# Patient Record
Sex: Male | Born: 1935 | Race: White | Hispanic: No | Marital: Married | Smoking: Never smoker
Health system: Southern US, Community
[De-identification: ages and names within clinical notes are randomized; demographics above are authoritative.]

## PROBLEM LIST (undated history)

## (undated) DIAGNOSIS — I10 Essential (primary) hypertension: Secondary | ICD-10-CM

## (undated) DIAGNOSIS — M1990 Unspecified osteoarthritis, unspecified site: Secondary | ICD-10-CM

## (undated) HISTORY — PX: JOINT REPLACEMENT: SHX530

## (undated) HISTORY — PX: BACK SURGERY: SHX140

---

## 2003-11-13 ENCOUNTER — Other Ambulatory Visit: Payer: Self-pay

## 2004-10-24 ENCOUNTER — Ambulatory Visit: Payer: Self-pay | Admitting: Physician Assistant

## 2005-02-20 ENCOUNTER — Ambulatory Visit: Payer: Self-pay | Admitting: Physician Assistant

## 2005-03-13 ENCOUNTER — Ambulatory Visit: Payer: Self-pay | Admitting: Pain Medicine

## 2005-07-23 ENCOUNTER — Ambulatory Visit: Payer: Self-pay | Admitting: Physician Assistant

## 2005-11-18 ENCOUNTER — Ambulatory Visit: Payer: Self-pay | Admitting: Physician Assistant

## 2006-03-19 ENCOUNTER — Ambulatory Visit: Payer: Self-pay | Admitting: Physician Assistant

## 2006-04-02 ENCOUNTER — Ambulatory Visit: Payer: Self-pay | Admitting: Pain Medicine

## 2006-04-20 ENCOUNTER — Ambulatory Visit: Payer: Self-pay | Admitting: Physician Assistant

## 2008-05-12 ENCOUNTER — Other Ambulatory Visit: Payer: Self-pay

## 2008-05-12 ENCOUNTER — Ambulatory Visit: Payer: Self-pay | Admitting: Unknown Physician Specialty

## 2008-05-19 ENCOUNTER — Ambulatory Visit: Payer: Self-pay | Admitting: Unknown Physician Specialty

## 2010-08-12 ENCOUNTER — Ambulatory Visit (HOSPITAL_COMMUNITY): Admission: RE | Admit: 2010-08-12 | Discharge: 2010-08-13 | Payer: Self-pay | Admitting: Neurosurgery

## 2011-03-07 LAB — TYPE AND SCREEN: Antibody Screen: NEGATIVE

## 2011-03-07 LAB — CBC
HCT: 42.5 % (ref 39.0–52.0)
MCH: 34.3 pg — ABNORMAL HIGH (ref 26.0–34.0)
MCHC: 34.6 g/dL (ref 30.0–36.0)
MCV: 99.1 fL (ref 78.0–100.0)
WBC: 6.1 10*3/uL (ref 4.0–10.5)

## 2011-03-07 LAB — POCT I-STAT 4, (NA,K, GLUC, HGB,HCT): HCT: 45 % (ref 39.0–52.0)

## 2011-03-07 LAB — BASIC METABOLIC PANEL
Calcium: 9.4 mg/dL (ref 8.4–10.5)
Chloride: 104 mEq/L (ref 96–112)
Creatinine, Ser: 1.04 mg/dL (ref 0.4–1.5)
GFR calc Af Amer: >60 mL/min (ref >60)

## 2011-03-07 LAB — DIFFERENTIAL
Eosinophils Relative: 2 % (ref 0–5)
Lymphocytes Relative: 32 % (ref 12–46)
Neutrophils Relative %: 51 % (ref 43–77)

## 2011-03-07 LAB — ABO/RH: ABO/RH(D): O NEG

## 2012-09-27 ENCOUNTER — Ambulatory Visit: Payer: Self-pay | Admitting: Unknown Physician Specialty

## 2012-10-15 ENCOUNTER — Ambulatory Visit: Payer: Self-pay | Admitting: Unknown Physician Specialty

## 2014-08-21 ENCOUNTER — Encounter (HOSPITAL_COMMUNITY): Payer: Self-pay | Admitting: Pharmacy Technician

## 2014-08-21 ENCOUNTER — Other Ambulatory Visit: Payer: Self-pay | Admitting: Orthopedic Surgery

## 2014-08-21 NOTE — Pre-Procedure Instructions (Signed)
Brad Mccarty  08/21/2014   Your procedure is scheduled on:  Wednesday, September 9th  Report to [facility] Admitting at 630 AM.  Call this number if you have problems the morning of surgery: [phone]   Remember:   Do not eat food or drink liquids after midnight.   Take these medicines the morning of surgery with A SIP OF WATER: allopurinol, hydrocodone if needed   Do not wear jewelry.  Do not wear lotions, powders, or perfumes. You may wear deodorant.  Do not shave 48 hours prior to surgery. Men may shave face and neck.  Do not bring valuables to the hospital.  [facility] is not responsible  for any belongings or valuables.               Contacts, dentures or bridgework may not be worn into surgery.  Leave suitcase in the car. After surgery it may be brought to your room.  For patients admitted to the hospital, discharge time is determined by your treatment team.               Patients discharged the day of surgery will not be allowed to drive home.  Please read over the following fact sheets that you were given: Pain Booklet, Coughing and Deep Breathing, Blood Transfusion Information, MRSA Information and Surgical Site Infection Prevention [place] - Preparing for Surgery  Before surgery, you can play an important role.  Because skin is not sterile, your skin needs to be as free of germs as possible.  You can reduce the number of germs on you skin by washing with CHG (chlorahexidine gluconate) soap before surgery.  CHG is an antiseptic cleaner which kills germs and bonds with the skin to continue killing germs even after washing.  Please DO NOT use if you have an allergy to CHG or antibacterial soaps.  If your skin becomes reddened/irritated stop using the CHG and inform your nurse when you arrive at Short Stay.  Do not shave (including legs and underarms) for at least 48 hours prior to the first CHG shower.  You may shave your face.  Please follow these  instructions carefully:   1.  Shower with CHG Soap the night before surgery and the morning of Surgery.  2.  If you choose to wash your hair, wash your hair first as usual with your normal shampoo.  3.  After you shampoo, rinse your hair and body thoroughly to remove the shampoo.  4.  Use CHG as you would any other liquid soap.  You can apply CHG directly to the skin and wash gently with scrungie or a clean washcloth.  5.  Apply the CHG Soap to your body ONLY FROM THE NECK DOWN.  Do not use on open wounds or open sores.  Avoid contact with your eyes, ears, mouth and genitals (private parts).  Wash genitals (private parts) with your normal soap.  6.  Wash thoroughly, paying special attention to the area where your surgery will be performed.  7.  Thoroughly rinse your body with warm water from the neck down.  8.  DO NOT shower/wash with your normal soap after using and rinsing off the CHG Soap.  9.  Pat yourself dry with a clean towel.            10.  Wear clean pajamas.            11.  Place clean sheets on your bed the night of  your first shower and do not sleep with pets.  Day of Surgery  Do not apply any lotions/deoderants the morning of surgery.  Please wear clean clothes to the hospital/surgery center.

## 2014-08-22 ENCOUNTER — Encounter (HOSPITAL_COMMUNITY)
Admission: RE | Admit: 2014-08-22 | Discharge: 2014-08-22 | Disposition: A | Discharge status: Home / Self Care | Payer: Medicare Other | Source: Ambulatory Visit | Attending: Anesthesiology | Admitting: Anesthesiology

## 2014-08-22 ENCOUNTER — Encounter (HOSPITAL_COMMUNITY)
Admission: RE | Admit: 2014-08-22 | Discharge: 2014-08-22 | Disposition: A | Discharge status: Home / Self Care | Payer: Medicare Other | Source: Ambulatory Visit | Attending: Orthopedic Surgery | Admitting: Orthopedic Surgery

## 2014-08-22 ENCOUNTER — Encounter (HOSPITAL_COMMUNITY): Payer: Self-pay

## 2014-08-22 DIAGNOSIS — M5137 Other intervertebral disc degeneration, lumbosacral region: Secondary | ICD-10-CM | POA: Insufficient documentation

## 2014-08-22 DIAGNOSIS — Z01818 Encounter for other preprocedural examination: Secondary | ICD-10-CM | POA: Diagnosis present

## 2014-08-22 DIAGNOSIS — M51379 Other intervertebral disc degeneration, lumbosacral region without mention of lumbar back pain or lower extremity pain: Secondary | ICD-10-CM | POA: Insufficient documentation

## 2014-08-22 DIAGNOSIS — Z9889 Other specified postprocedural states: Secondary | ICD-10-CM | POA: Insufficient documentation

## 2014-08-22 DIAGNOSIS — M48061 Spinal stenosis, lumbar region without neurogenic claudication: Secondary | ICD-10-CM | POA: Diagnosis not present

## 2014-08-22 HISTORY — DX: Essential (primary) hypertension: I10

## 2014-08-22 HISTORY — DX: Unspecified osteoarthritis, unspecified site: M19.90

## 2014-08-22 LAB — SURGICAL PCR SCREEN
MRSA, PCR: POSITIVE — AB
Staphylococcus aureus: POSITIVE — AB

## 2014-08-22 LAB — CBC
HCT: 42.9 % (ref 39.0–52.0)
HEMOGLOBIN: 14.8 g/dL (ref 13.0–17.0)
MCH: 34.7 pg — AB (ref 26.0–34.0)
MCHC: 34.5 g/dL (ref 30.0–36.0)
MCV: 100.5 fL — AB (ref 78.0–100.0)
Platelets: 222 10*3/uL (ref 150–400)
RBC: 4.27 MIL/uL (ref 4.22–5.81)
RDW: 13.4 % (ref 11.5–15.5)
WBC: 9.6 10*3/uL (ref 4.0–10.5)

## 2014-08-22 LAB — BASIC METABOLIC PANEL
Anion gap: 15 (ref 5–15)
BUN: 27 mg/dL — ABNORMAL HIGH (ref 6–23)
CO2: 24 meq/L (ref 19–32)
CREATININE: 1.21 mg/dL (ref 0.50–1.35)
Calcium: 9.7 mg/dL (ref 8.4–10.5)
Chloride: 102 mEq/L (ref 96–112)
GFR calc Af Amer: 64 mL/min — ABNORMAL LOW (ref >90)
GFR calc non Af Amer: 56 mL/min — ABNORMAL LOW (ref >90)
GLUCOSE: 97 mg/dL (ref 70–99)
Potassium: 6.9 mEq/L (ref 3.7–5.3)
Sodium: 141 mEq/L (ref 137–147)

## 2014-08-22 LAB — TYPE AND SCREEN
ABO/RH(D): O NEG
Antibody Screen: NEGATIVE

## 2014-08-22 NOTE — Progress Notes (Signed)
rx called into [facility] [place] for mupirocin

## 2014-08-22 NOTE — Progress Notes (Signed)
Primary - dr. Mikey Bussing ([facility]) No cardiologist - no ekg in last 5 years

## 2014-08-23 NOTE — Progress Notes (Addendum)
Anesthesia Chart Review:  Patient is a 80 year old male is a 80 year old male posted for L5-S1 decompression and fusion with allograft on 08/30/14 by Dr. Yevette Edwards.  History includes HTN, arthritis, bilateral TKR. He is also on Aricept. PCP is Dr. Lindwood [facility]; [phone]; fax [phone]).   Meds: Vitamin C, cyanocobalamin, Norco, allopurinal, Aricept, Lescol, Folvite, Neurontin, Benicar, Clinoril.  EKG on 08/22/14 showed: NSR, incomplete right BBB. I don't see a comparison EKG in Muse of Epic.  CXR on 08/22/14 showed: No active cardiopulmonary disease.  Preoperative labs noted.  K 6.9, BUN 27, Cr 1.21, Glucose 97.  H/H 14.8/42.9. PLT count 222K. I notified Dr. Marshell Levan PA Dorathy Daft about elevated K+, hemolysis is likely effecting.  She recommended having patient get repeat labs at his PCP office (he lives in [place]) to ensure hyperkalemia is resolved prior to surgery. I have notified Carla at Dr. Marshell Levan office.  She will notify Mr. Lemming and touch base with Dr. Neita Garnet office as well. I asked that she fax me his repeat lab results once received--otherwise he will need an ISTAT on arrival.  Velna Ochs [facility] Short Stay Center/Anesthesiology Phone [phone] 08/23/2014 2:59 PM  Addendum: 08/23/2014 3:52 PM Carla spoke with Dr. Neita Garnet office.  They were going to have him come in for repeat labs on 08/29/14.  When she called to notify the patient, he reported that he actually has to be in [place] on 08/29/14 so it would be easier for him to get labs repeated at [facility].  Also Dr. Marshell Levan orders are now in Epic so there are some additional labs that will need to be done as well (includes UA, CMET [will only have them do a HFP since he already had a BMET], PT/PTT). I've ordered an ISTAT4 so we'll have a quick repeat potassium result. Albin Felling will tell him to arrive between 8:30 AM and 3:00 PM on 08/29/14. I'll have our schedulers create a lab only visit.    Addendum:  08/24/2014 9:23 AM Received another call from [place].  Patient has now decided to get labs at his PCP office on 08/29/14.  She will fax his repeat labs once received.  I left a message for Albin Felling regarding labs that weren't done at [facility] due to orders not being signed. Additional labs not yet done that were ordered by the surgeon will need to be done on the day of surgery.   Addendum: 08/29/2014 5:15 PM I received labs from PCP this afternoon. BMET done there today showed a K of 4.7 but elevated BUN 42 and Cr 1.8 (previously 27, 1.21 on 08/22/14).  I left a voice message for Dr. Mikey Bussing around 3:30 PM today, but just received a call back from his nurse Steward Drone. She reports his previous BUN/Cr from 08/01/13 were 22/1.20.  Dr. Mikey Bussing has reviewed today's labs and is not completely sure why his BUN/Cr are further elevated now.  Since his surgery was scheduled for tomorrow morning, I had already reviewed these new labs with anesthesiologist Dr. Noreene Larsson.  He recommended repeating a BMET (already scheduled for CMET) tomorrow.  If Cr is further elevated at 2.0 or above, surgery would likely be canceled.  If stable or improved and other labs acceptable then hopefully he could proceed as planned with continued monitoring of his renal function post-operatively.  Steward Drone reported that this seemed like a reasonable plan from their standpoint.  Further evaluation and review of same day labs per his assigned anesthesiologist tomorrow morning.

## 2014-08-29 MED ORDER — CEFAZOLIN SODIUM-DEXTROSE 2-3 GM-% IV SOLR
2.0000 g | INTRAVENOUS | Status: AC
  Administered 2014-08-30 (×2): 2 g via INTRAVENOUS
  Filled 2014-08-29: qty 50

## 2014-08-30 ENCOUNTER — Inpatient Hospital Stay (HOSPITAL_COMMUNITY)
Admission: RE | Admit: 2014-08-30 | Discharge: 2014-09-01 | DRG: 460 | Disposition: A | Discharge status: Home Health | Payer: Medicare Other | Source: Ambulatory Visit | Attending: Orthopedic Surgery | Admitting: Orthopedic Surgery

## 2014-08-30 ENCOUNTER — Encounter (HOSPITAL_COMMUNITY): Payer: Self-pay

## 2014-08-30 ENCOUNTER — Encounter (HOSPITAL_COMMUNITY)
Admission: RE | Disposition: A | Discharge status: Home Health | Payer: Medicare Other | Source: Ambulatory Visit | Attending: Orthopedic Surgery

## 2014-08-30 ENCOUNTER — Inpatient Hospital Stay (HOSPITAL_COMMUNITY): Payer: Medicare Other | Admitting: Anesthesiology

## 2014-08-30 ENCOUNTER — Encounter (HOSPITAL_COMMUNITY): Payer: Medicare Other | Admitting: Vascular Surgery

## 2014-08-30 ENCOUNTER — Inpatient Hospital Stay (HOSPITAL_COMMUNITY): Payer: Medicare Other

## 2014-08-30 DIAGNOSIS — I10 Essential (primary) hypertension: Secondary | ICD-10-CM | POA: Diagnosis present

## 2014-08-30 DIAGNOSIS — Z981 Arthrodesis status: Secondary | ICD-10-CM

## 2014-08-30 DIAGNOSIS — Z96659 Presence of unspecified artificial knee joint: Secondary | ICD-10-CM | POA: Diagnosis not present

## 2014-08-30 DIAGNOSIS — Z79899 Other long term (current) drug therapy: Secondary | ICD-10-CM | POA: Diagnosis not present

## 2014-08-30 DIAGNOSIS — M5410 Radiculopathy, site unspecified: Secondary | ICD-10-CM | POA: Diagnosis present

## 2014-08-30 DIAGNOSIS — M79609 Pain in unspecified limb: Secondary | ICD-10-CM | POA: Diagnosis present

## 2014-08-30 DIAGNOSIS — I959 Hypotension, unspecified: Secondary | ICD-10-CM | POA: Diagnosis present

## 2014-08-30 DIAGNOSIS — M5137 Other intervertebral disc degeneration, lumbosacral region: Principal | ICD-10-CM | POA: Diagnosis present

## 2014-08-30 DIAGNOSIS — M51379 Other intervertebral disc degeneration, lumbosacral region without mention of lumbar back pain or lower extremity pain: Principal | ICD-10-CM | POA: Diagnosis present

## 2014-08-30 LAB — CBC WITH DIFFERENTIAL/PLATELET
BASOS ABS: 0.1 10*3/uL (ref 0.0–0.1)
BASOS PCT: 1 % (ref 0–1)
EOS ABS: 0.3 10*3/uL (ref 0.0–0.7)
Eosinophils Relative: 4 % (ref 0–5)
HCT: 36.6 % — ABNORMAL LOW (ref 39.0–52.0)
HEMOGLOBIN: 11 g/dL — AB (ref 13.0–17.0)
LYMPHS PCT: 33 % (ref 12–46)
Lymphs Abs: 2.3 10*3/uL (ref 0.7–4.0)
MCH: 30.3 pg (ref 26.0–34.0)
MCHC: 30.1 g/dL (ref 30.0–36.0)
MCV: 100.8 fL — ABNORMAL HIGH (ref 78.0–100.0)
MONO ABS: 1 10*3/uL (ref 0.1–1.0)
Monocytes Relative: 14 % — ABNORMAL HIGH (ref 3–12)
NEUTROS PCT: 48 % (ref 43–77)
Neutro Abs: 3.3 10*3/uL (ref 1.7–7.7)
PLATELETS: 146 10*3/uL — AB (ref 150–400)
RBC: 3.63 MIL/uL — ABNORMAL LOW (ref 4.22–5.81)
RDW: 13 % (ref 11.5–15.5)
WBC: 7 10*3/uL (ref 4.0–10.5)

## 2014-08-30 LAB — COMPREHENSIVE METABOLIC PANEL
ALBUMIN: 3.5 g/dL (ref 3.5–5.2)
ALT: 13 U/L (ref 0–53)
ANION GAP: 13 (ref 5–15)
AST: 19 U/L (ref 0–37)
Alkaline Phosphatase: 63 U/L (ref 39–117)
BUN: 35 mg/dL — ABNORMAL HIGH (ref 6–23)
CO2: 24 mEq/L (ref 19–32)
Calcium: 8.9 mg/dL (ref 8.4–10.5)
Chloride: 98 mEq/L (ref 96–112)
Creatinine, Ser: 1.25 mg/dL (ref 0.50–1.35)
GFR calc Af Amer: 62 mL/min — ABNORMAL LOW (ref >90)
GFR calc non Af Amer: 53 mL/min — ABNORMAL LOW (ref >90)
Glucose, Bld: 90 mg/dL (ref 70–99)
POTASSIUM: 4.8 meq/L (ref 3.7–5.3)
SODIUM: 135 meq/L — AB (ref 137–147)
TOTAL PROTEIN: 6.5 g/dL (ref 6.0–8.3)
Total Bilirubin: 0.7 mg/dL (ref 0.3–1.2)

## 2014-08-30 LAB — APTT: APTT: 36 s (ref 24–37)

## 2014-08-30 LAB — PROTIME-INR
INR: 1.02 (ref 0.00–1.49)
PROTHROMBIN TIME: 13.4 s (ref 11.6–15.2)

## 2014-08-30 SURGERY — POSTERIOR LUMBAR FUSION 1 LEVEL
Anesthesia: General | Site: Spine Lumbar

## 2014-08-30 MED ORDER — DIPHENHYDRAMINE HCL 12.5 MG/5ML PO ELIX: 12.5000 mg | ORAL_SOLUTION | Freq: Four times a day (QID) | ORAL | Status: DC | PRN

## 2014-08-30 MED ORDER — SODIUM CHLORIDE 0.9 % IV SOLN: 250.0000 mL | INTRAVENOUS | Status: DC

## 2014-08-30 MED ORDER — DIAZEPAM 5 MG PO TABS
5.0000 mg | ORAL_TABLET | Freq: Four times a day (QID) | ORAL | Status: DC | PRN
  Administered 2014-08-30 – 2014-08-31 (×2): 5 mg via ORAL
  Filled 2014-08-30 (×2): qty 1

## 2014-08-30 MED ORDER — GLYCOPYRROLATE 0.2 MG/ML IJ SOLN
INTRAMUSCULAR | Status: DC | PRN
  Administered 2014-08-30: 0.4 mg via INTRAVENOUS

## 2014-08-30 MED ORDER — POVIDONE-IODINE 7.5 % EX SOLN
Freq: Once | CUTANEOUS | Status: DC
  Filled 2014-08-30: qty 118

## 2014-08-30 MED ORDER — ONDANSETRON HCL 4 MG/2ML IJ SOLN: 4.0000 mg | Freq: Once | INTRAMUSCULAR | Status: DC | PRN

## 2014-08-30 MED ORDER — ACETAMINOPHEN 650 MG RE SUPP
650.0000 mg | RECTAL | Status: DC | PRN
Start: 2014-08-30 — End: 2014-09-01

## 2014-08-30 MED ORDER — BUPIVACAINE-EPINEPHRINE 0.25% -1:200000 IJ SOLN
INTRAMUSCULAR | Status: DC | PRN
  Administered 2014-08-30: 21 mL
  Administered 2014-08-30: 4 mL

## 2014-08-30 MED ORDER — DOCUSATE SODIUM 100 MG PO CAPS
100.0000 mg | ORAL_CAPSULE | Freq: Two times a day (BID) | ORAL | Status: DC
  Administered 2014-08-30 – 2014-09-01 (×4): 100 mg via ORAL
  Filled 2014-08-30 (×5): qty 1

## 2014-08-30 MED ORDER — LACTATED RINGERS IV SOLN
INTRAVENOUS | Status: DC | PRN
  Administered 2014-08-30 (×2): via INTRAVENOUS

## 2014-08-30 MED ORDER — MORPHINE SULFATE 2 MG/ML IJ SOLN: 1.0000 mg | INTRAMUSCULAR | Status: DC | PRN

## 2014-08-30 MED ORDER — OXYCODONE-ACETAMINOPHEN 5-325 MG PO TABS
1.0000 | ORAL_TABLET | ORAL | Status: DC | PRN
  Administered 2014-08-31 – 2014-09-01 (×7): 1 via ORAL
  Filled 2014-08-30 (×3): qty 1
  Filled 2014-08-30: qty 2
  Filled 2014-08-30 (×4): qty 1

## 2014-08-30 MED ORDER — ZOLPIDEM TARTRATE 5 MG PO TABS: 5.0000 mg | ORAL_TABLET | Freq: Every evening | ORAL | Status: DC | PRN

## 2014-08-30 MED ORDER — THROMBIN 20000 UNITS EX KIT
PACK | CUTANEOUS | Status: DC | PRN
  Administered 2014-08-30: 20000 [IU] via TOPICAL

## 2014-08-30 MED ORDER — CYANOCOBALAMIN 2500 MCG PO CHEW: 2500.0000 ug | CHEWABLE_TABLET | Freq: Every day | ORAL | Status: DC

## 2014-08-30 MED ORDER — SURGIFOAM 100 EX MISC
CUTANEOUS | Status: DC | PRN
  Administered 2014-08-30: 09:00:00 via TOPICAL

## 2014-08-30 MED ORDER — ROCURONIUM BROMIDE 50 MG/5ML IV SOLN
INTRAVENOUS | Status: AC
  Filled 2014-08-30: qty 1

## 2014-08-30 MED ORDER — ALLOPURINOL 300 MG PO TABS
300.0000 mg | ORAL_TABLET | Freq: Every morning | ORAL | Status: DC
  Administered 2014-08-31 – 2014-09-01 (×2): 300 mg via ORAL
  Filled 2014-08-30 (×2): qty 1

## 2014-08-30 MED ORDER — BISACODYL 5 MG PO TBEC
5.0000 mg | DELAYED_RELEASE_TABLET | Freq: Every day | ORAL | Status: DC | PRN
Start: 2014-08-30 — End: 2014-09-01

## 2014-08-30 MED ORDER — ONDANSETRON HCL 4 MG/2ML IJ SOLN
INTRAMUSCULAR | Status: DC | PRN
  Administered 2014-08-30: 4 mg via INTRAVENOUS

## 2014-08-30 MED ORDER — LIDOCAINE HCL (CARDIAC) 20 MG/ML IV SOLN
INTRAVENOUS | Status: AC
  Filled 2014-08-30: qty 5

## 2014-08-30 MED ORDER — GABAPENTIN 600 MG PO TABS
600.0000 mg | ORAL_TABLET | Freq: Every day | ORAL | Status: DC
  Administered 2014-08-30 – 2014-08-31 (×2): 600 mg via ORAL
  Filled 2014-08-30 (×3): qty 1

## 2014-08-30 MED ORDER — SODIUM CHLORIDE 0.9 % IJ SOLN: 9.0000 mL | INTRAMUSCULAR | Status: DC | PRN

## 2014-08-30 MED ORDER — DIPHENHYDRAMINE HCL 50 MG/ML IJ SOLN: 12.5000 mg | Freq: Four times a day (QID) | INTRAMUSCULAR | Status: DC | PRN

## 2014-08-30 MED ORDER — DONEPEZIL HCL 5 MG PO TABS
5.0000 mg | ORAL_TABLET | Freq: Every day | ORAL | Status: DC
  Administered 2014-08-30 – 2014-08-31 (×2): 5 mg via ORAL
  Filled 2014-08-30 (×3): qty 1

## 2014-08-30 MED ORDER — NEOSTIGMINE METHYLSULFATE 10 MG/10ML IV SOLN
INTRAVENOUS | Status: AC
  Filled 2014-08-30: qty 1

## 2014-08-30 MED ORDER — SUFENTANIL CITRATE 50 MCG/ML IV SOLN
INTRAVENOUS | Status: DC | PRN
  Administered 2014-08-30 (×5): 5 ug via INTRAVENOUS
  Administered 2014-08-30: 15 ug via INTRAVENOUS

## 2014-08-30 MED ORDER — ONDANSETRON HCL 4 MG/2ML IJ SOLN
INTRAMUSCULAR | Status: AC
  Filled 2014-08-30: qty 2

## 2014-08-30 MED ORDER — FOLIC ACID 0.5 MG HALF TAB
0.5000 mg | ORAL_TABLET | Freq: Every day | ORAL | Status: DC
  Administered 2014-08-31 – 2014-09-01 (×2): 0.5 mg via ORAL
  Filled 2014-08-30 (×2): qty 1

## 2014-08-30 MED ORDER — NALOXONE HCL 0.4 MG/ML IJ SOLN
0.4000 mg | INTRAMUSCULAR | Status: DC | PRN
Start: 2014-08-30 — End: 2014-09-01

## 2014-08-30 MED ORDER — SODIUM CHLORIDE 0.9 % IV SOLN
INTRAVENOUS | Status: DC
  Administered 2014-08-30: 17:00:00 via INTRAVENOUS

## 2014-08-30 MED ORDER — ALUM & MAG HYDROXIDE-SIMETH 200-200-20 MG/5ML PO SUSP: 30.0000 mL | Freq: Four times a day (QID) | ORAL | Status: DC | PRN

## 2014-08-30 MED ORDER — CEFAZOLIN SODIUM 1-5 GM-% IV SOLN
1.0000 g | Freq: Three times a day (TID) | INTRAVENOUS | Status: AC
  Administered 2014-08-30 – 2014-08-31 (×2): 1 g via INTRAVENOUS
  Filled 2014-08-30 (×2): qty 50

## 2014-08-30 MED ORDER — THROMBIN 20000 UNITS EX SOLR
CUTANEOUS | Status: AC
  Filled 2014-08-30: qty 40000

## 2014-08-30 MED ORDER — VITAMIN B-12 1000 MCG PO TABS
2500.0000 ug | ORAL_TABLET | Freq: Every day | ORAL | Status: DC
  Administered 2014-08-31 – 2014-09-01 (×2): 2500 ug via ORAL
  Filled 2014-08-30 (×2): qty 1

## 2014-08-30 MED ORDER — ALBUMIN HUMAN 5 % IV SOLN
INTRAVENOUS | Status: DC | PRN
  Administered 2014-08-30: 11:00:00 via INTRAVENOUS

## 2014-08-30 MED ORDER — EPHEDRINE SULFATE 50 MG/ML IJ SOLN
INTRAMUSCULAR | Status: DC | PRN
  Administered 2014-08-30: 10 mg via INTRAVENOUS

## 2014-08-30 MED ORDER — BUPIVACAINE-EPINEPHRINE (PF) 0.25% -1:200000 IJ SOLN
INTRAMUSCULAR | Status: AC
  Filled 2014-08-30: qty 30

## 2014-08-30 MED ORDER — OXYCODONE HCL 5 MG PO TABS: 5.0000 mg | ORAL_TABLET | Freq: Once | ORAL | Status: DC | PRN

## 2014-08-30 MED ORDER — ARTIFICIAL TEARS OP OINT
TOPICAL_OINTMENT | OPHTHALMIC | Status: AC
  Filled 2014-08-30: qty 3.5

## 2014-08-30 MED ORDER — SUFENTANIL CITRATE 50 MCG/ML IV SOLN
INTRAVENOUS | Status: AC
  Filled 2014-08-30: qty 1

## 2014-08-30 MED ORDER — SODIUM CHLORIDE 0.9 % IV SOLN
INTRAVENOUS | Status: DC | PRN
  Administered 2014-08-30: 12:00:00 via INTRAVENOUS

## 2014-08-30 MED ORDER — ROCURONIUM BROMIDE 100 MG/10ML IV SOLN
INTRAVENOUS | Status: DC | PRN
  Administered 2014-08-30: 40 mg via INTRAVENOUS
  Administered 2014-08-30 (×6): 10 mg via INTRAVENOUS

## 2014-08-30 MED ORDER — IRBESARTAN 300 MG PO TABS
300.0000 mg | ORAL_TABLET | Freq: Every day | ORAL | Status: DC
  Filled 2014-08-30 (×2): qty 1

## 2014-08-30 MED ORDER — ONDANSETRON HCL 4 MG/2ML IJ SOLN: 4.0000 mg | INTRAMUSCULAR | Status: DC | PRN

## 2014-08-30 MED ORDER — PHENOL 1.4 % MT LIQD: 1.0000 | OROMUCOSAL | Status: DC | PRN

## 2014-08-30 MED ORDER — ONDANSETRON HCL 4 MG/2ML IJ SOLN
4.0000 mg | Freq: Four times a day (QID) | INTRAMUSCULAR | Status: DC | PRN
  Filled 2014-08-30: qty 2

## 2014-08-30 MED ORDER — CEFAZOLIN SODIUM-DEXTROSE 2-3 GM-% IV SOLR
INTRAVENOUS | Status: AC
  Filled 2014-08-30: qty 50

## 2014-08-30 MED ORDER — PHENYLEPHRINE HCL 10 MG/ML IJ SOLN
10.0000 mg | INTRAVENOUS | Status: DC | PRN
  Administered 2014-08-30: 40 ug/min via INTRAVENOUS

## 2014-08-30 MED ORDER — SODIUM CHLORIDE 0.9 % IJ SOLN
INTRAMUSCULAR | Status: AC
  Filled 2014-08-30: qty 10

## 2014-08-30 MED ORDER — PROPOFOL 10 MG/ML IV BOLUS
INTRAVENOUS | Status: DC | PRN
  Administered 2014-08-30: 180 mg via INTRAVENOUS
  Administered 2014-08-30: 20 mg via INTRAVENOUS

## 2014-08-30 MED ORDER — GLYCOPYRROLATE 0.2 MG/ML IJ SOLN
INTRAMUSCULAR | Status: AC
  Filled 2014-08-30: qty 2

## 2014-08-30 MED ORDER — OXYCODONE HCL 5 MG/5ML PO SOLN: 5.0000 mg | Freq: Once | ORAL | Status: DC | PRN

## 2014-08-30 MED ORDER — METHYLENE BLUE 1 % INJ SOLN
INTRAMUSCULAR | Status: AC
  Filled 2014-08-30: qty 10

## 2014-08-30 MED ORDER — MORPHINE SULFATE (PF) 1 MG/ML IV SOLN
INTRAVENOUS | Status: DC
  Administered 2014-08-30 (×2): via INTRAVENOUS
  Administered 2014-08-30: 14 mg via INTRAVENOUS
  Administered 2014-08-31: 6 mg via INTRAVENOUS
  Filled 2014-08-30: qty 25

## 2014-08-30 MED ORDER — FENTANYL CITRATE 0.05 MG/ML IJ SOLN
25.0000 ug | INTRAMUSCULAR | Status: DC | PRN
Start: 2014-08-30 — End: 2014-08-30

## 2014-08-30 MED ORDER — MENTHOL 3 MG MT LOZG: 1.0000 | LOZENGE | OROMUCOSAL | Status: DC | PRN

## 2014-08-30 MED ORDER — MIDAZOLAM HCL 2 MG/2ML IJ SOLN
INTRAMUSCULAR | Status: AC
  Filled 2014-08-30: qty 2

## 2014-08-30 MED ORDER — SODIUM CHLORIDE 0.9 % IJ SOLN
3.0000 mL | Freq: Two times a day (BID) | INTRAMUSCULAR | Status: DC
  Administered 2014-08-31: 3 mL via INTRAVENOUS

## 2014-08-30 MED ORDER — SENNOSIDES-DOCUSATE SODIUM 8.6-50 MG PO TABS: 1.0000 | ORAL_TABLET | Freq: Every evening | ORAL | Status: DC | PRN

## 2014-08-30 MED ORDER — FOLIC ACID 400 MCG PO TABS: 400.0000 ug | ORAL_TABLET | Freq: Every day | ORAL | Status: DC

## 2014-08-30 MED ORDER — SIMVASTATIN 40 MG PO TABS
40.0000 mg | ORAL_TABLET | Freq: Every day | ORAL | Status: DC
  Administered 2014-08-30 – 2014-08-31 (×2): 40 mg via ORAL
  Filled 2014-08-30 (×3): qty 1

## 2014-08-30 MED ORDER — MORPHINE SULFATE (PF) 1 MG/ML IV SOLN
INTRAVENOUS | Status: AC
  Filled 2014-08-30: qty 25

## 2014-08-30 MED ORDER — FLEET ENEMA 7-19 GM/118ML RE ENEM: 1.0000 | ENEMA | Freq: Once | RECTAL | Status: AC | PRN

## 2014-08-30 MED ORDER — NEOSTIGMINE METHYLSULFATE 10 MG/10ML IV SOLN
INTRAVENOUS | Status: DC | PRN
  Administered 2014-08-30: 3 mg via INTRAVENOUS

## 2014-08-30 MED ORDER — PROPOFOL 10 MG/ML IV BOLUS
INTRAVENOUS | Status: AC
  Filled 2014-08-30: qty 20

## 2014-08-30 MED ORDER — ACETAMINOPHEN 325 MG PO TABS: 650.0000 mg | ORAL_TABLET | ORAL | Status: DC | PRN

## 2014-08-30 MED ORDER — SULINDAC 200 MG PO TABS
200.0000 mg | ORAL_TABLET | Freq: Every morning | ORAL | Status: DC
  Administered 2014-08-31 – 2014-09-01 (×2): 200 mg via ORAL
  Filled 2014-08-30 (×2): qty 1

## 2014-08-30 MED ORDER — VITAMIN C 500 MG PO TABS
1000.0000 mg | ORAL_TABLET | Freq: Every day | ORAL | Status: DC
  Administered 2014-08-31 – 2014-09-01 (×2): 1000 mg via ORAL
  Filled 2014-08-30 (×2): qty 2

## 2014-08-30 MED ORDER — 0.9 % SODIUM CHLORIDE (POUR BTL) OPTIME
TOPICAL | Status: DC | PRN
  Administered 2014-08-30 (×3): 1000 mL

## 2014-08-30 MED ORDER — SODIUM CHLORIDE 0.9 % IJ SOLN: 3.0000 mL | INTRAMUSCULAR | Status: DC | PRN

## 2014-08-30 SURGICAL SUPPLY — 82 items
BENZOIN TINCTURE PRP APPL 2/3 (GAUZE/BANDAGES/DRESSINGS) ×3 IMPLANT
BLADE SURG ROTATE 9660 (MISCELLANEOUS) ×3 IMPLANT
BUR ROUND PRECISION 4.0 (BURR) ×2 IMPLANT
BUR ROUND PRECISION 4.0MM (BURR) ×1
CARTRIDGE OIL MAESTRO DRILL (MISCELLANEOUS) ×1 IMPLANT
CLOSURE WOUND 1/2 X4 (GAUZE/BANDAGES/DRESSINGS) ×1
CONT SPEC STER OR (MISCELLANEOUS) ×3 IMPLANT
CORDS BIPOLAR (ELECTRODE) ×3 IMPLANT
COVER MAYO STAND STRL (DRAPES) ×6 IMPLANT
COVER SURGICAL LIGHT HANDLE (MISCELLANEOUS) ×3 IMPLANT
DIFFUSER DRILL AIR PNEUMATIC (MISCELLANEOUS) ×3 IMPLANT
DRAIN CHANNEL 15F RND FF W/TCR (WOUND CARE) ×3 IMPLANT
DRAPE C-ARM 42X72 X-RAY (DRAPES) ×3 IMPLANT
DRAPE ORTHO SPLIT 77X108 STRL (DRAPES) ×2
DRAPE POUCH INSTRU U-SHP 10X18 (DRAPES) ×3 IMPLANT
DRAPE SURG 17X23 STRL (DRAPES) ×9 IMPLANT
DRAPE SURG ORHT 6 SPLT 77X108 (DRAPES) ×1 IMPLANT
DURAPREP 26ML APPLICATOR (WOUND CARE) ×3 IMPLANT
ELECT BLADE 4.0 EZ CLEAN MEGAD (MISCELLANEOUS) ×3
ELECT CAUTERY BLADE 6.4 (BLADE) ×3 IMPLANT
ELECT REM PT RETURN 9FT ADLT (ELECTROSURGICAL) ×3
ELECTRODE BLDE 4.0 EZ CLN MEGD (MISCELLANEOUS) ×1 IMPLANT
ELECTRODE REM PT RTRN 9FT ADLT (ELECTROSURGICAL) ×1 IMPLANT
EVACUATOR SILICONE 100CC (DRAIN) ×3 IMPLANT
GAUZE SPONGE 4X4 12PLY STRL (GAUZE/BANDAGES/DRESSINGS) ×3 IMPLANT
GAUZE SPONGE 4X4 16PLY XRAY LF (GAUZE/BANDAGES/DRESSINGS) ×6 IMPLANT
GLOVE BIO SURGEON STRL SZ7 (GLOVE) ×3 IMPLANT
GLOVE BIO SURGEON STRL SZ8 (GLOVE) ×6 IMPLANT
GLOVE BIOGEL PI IND STRL 6 (GLOVE) ×1 IMPLANT
GLOVE BIOGEL PI IND STRL 6.5 (GLOVE) ×1 IMPLANT
GLOVE BIOGEL PI IND STRL 7.0 (GLOVE) ×1 IMPLANT
GLOVE BIOGEL PI IND STRL 8 (GLOVE) ×1 IMPLANT
GLOVE BIOGEL PI INDICATOR 6 (GLOVE) ×2
GLOVE BIOGEL PI INDICATOR 6.5 (GLOVE) ×2
GLOVE BIOGEL PI INDICATOR 7.0 (GLOVE) ×2
GLOVE BIOGEL PI INDICATOR 8 (GLOVE) ×2
GLOVE BIOGEL PI ORTHO PRO SZ7 (GLOVE) ×4
GLOVE PI ORTHO PRO STRL SZ7 (GLOVE) ×2 IMPLANT
GLOVE SURG SS PI 6.0 STRL IVOR (GLOVE) ×3 IMPLANT
GLOVE SURG SS PI 6.5 STRL IVOR (GLOVE) ×3 IMPLANT
GLOVE SURG SS PI 7.0 STRL IVOR (GLOVE) ×12 IMPLANT
GOWN STRL REUS W/ TWL LRG LVL3 (GOWN DISPOSABLE) ×3 IMPLANT
GOWN STRL REUS W/ TWL XL LVL3 (GOWN DISPOSABLE) ×3 IMPLANT
GOWN STRL REUS W/TWL LRG LVL3 (GOWN DISPOSABLE) ×6
GOWN STRL REUS W/TWL XL LVL3 (GOWN DISPOSABLE) ×6
IV CATH 14GX2 1/4 (CATHETERS) ×3 IMPLANT
KIT BASIN OR (CUSTOM PROCEDURE TRAY) ×3 IMPLANT
KIT POSITION SURG JACKSON T1 (MISCELLANEOUS) ×3 IMPLANT
KIT ROOM TURNOVER OR (KITS) ×3 IMPLANT
MARKER SKIN DUAL TIP RULER LAB (MISCELLANEOUS) ×3 IMPLANT
NDL SAFETY ECLIPSE 18X1.5 (NEEDLE) IMPLANT
NEEDLE 22X1 1/2 (OR ONLY) (NEEDLE) ×3 IMPLANT
NEEDLE BONE MARROW 8GX6 FENEST (NEEDLE) IMPLANT
NEEDLE HYPO 18GX1.5 SHARP (NEEDLE)
NEEDLE HYPO 25GX1X1/2 BEV (NEEDLE) ×3 IMPLANT
NEEDLE SPNL 18GX3.5 QUINCKE PK (NEEDLE) ×6 IMPLANT
NS IRRIG 1000ML POUR BTL (IV SOLUTION) ×9 IMPLANT
OIL CARTRIDGE MAESTRO DRILL (MISCELLANEOUS) ×3
PACK LAMINECTOMY ORTHO (CUSTOM PROCEDURE TRAY) ×3 IMPLANT
PACK UNIVERSAL I (CUSTOM PROCEDURE TRAY) ×3 IMPLANT
PAD ARMBOARD 7.5X6 YLW CONV (MISCELLANEOUS) ×6 IMPLANT
PATTIES SURGICAL .5 X1 (DISPOSABLE) ×3 IMPLANT
PATTIES SURGICAL .5X1.5 (GAUZE/BANDAGES/DRESSINGS) IMPLANT
SPONGE INTESTINAL PEANUT (DISPOSABLE) IMPLANT
SPONGE SURGIFOAM ABS GEL 100 (HEMOSTASIS) ×3 IMPLANT
STRIP CLOSURE SKIN 1/2X4 (GAUZE/BANDAGES/DRESSINGS) ×2 IMPLANT
SURGIFLO TRUKIT (HEMOSTASIS) IMPLANT
SUT MNCRL AB 4-0 PS2 18 (SUTURE) ×3 IMPLANT
SUT VIC AB 0 CT1 18XCR BRD 8 (SUTURE) ×1 IMPLANT
SUT VIC AB 0 CT1 8-18 (SUTURE) ×2
SUT VIC AB 1 CT1 18XCR BRD 8 (SUTURE) ×1 IMPLANT
SUT VIC AB 1 CT1 8-18 (SUTURE) ×2
SUT VIC AB 2-0 CT2 18 VCP726D (SUTURE) ×6 IMPLANT
SYR 20CC LL (SYRINGE) ×3 IMPLANT
SYR BULB IRRIGATION 50ML (SYRINGE) ×3 IMPLANT
SYR CONTROL 10ML LL (SYRINGE) ×6 IMPLANT
SYR TB 1ML LUER SLIP (SYRINGE) ×3 IMPLANT
TOWEL OR 17X24 6PK STRL BLUE (TOWEL DISPOSABLE) ×3 IMPLANT
TOWEL OR 17X26 10 PK STRL BLUE (TOWEL DISPOSABLE) ×3 IMPLANT
TRAY FOLEY CATH 16FRSI W/METER (SET/KITS/TRAYS/PACK) ×3 IMPLANT
WATER STERILE IRR 1000ML POUR (IV SOLUTION) ×3 IMPLANT
YANKAUER SUCT BULB TIP NO VENT (SUCTIONS) ×6 IMPLANT

## 2014-08-30 NOTE — Progress Notes (Signed)
Utilization review completed.  

## 2014-08-30 NOTE — Transfer of Care (Signed)
Immediate Anesthesia Transfer of Care Note  Patient: Brad Mccarty  Procedure(s) Performed: Procedure(s) with comments: POSTERIOR LUMBAR FUSION 1 LEVEL (N/A) - Lumbar 5-sacrum 1 decompression and fusion with allograft  Patient Location: PACU  Anesthesia Type:General  Level of Consciousness: awake, alert  and patient cooperative  Airway & Oxygen Therapy: Patient Spontanous Breathing and Patient connected to face mask oxygen  Post-op Assessment: Report given to PACU RN, Post -op Vital signs reviewed and stable, Patient moving all extremities and Patient moving all extremities X 4  Post vital signs: Reviewed and stable  Complications: No apparent anesthesia complications

## 2014-08-30 NOTE — Anesthesia Procedure Notes (Signed)
Procedure Name: Intubation Date/Time: 08/30/2014 8:42 AM Performed by: Coralee Rud Pre-anesthesia Checklist: Patient identified, Suction available, Emergency Drugs available and Patient being monitored Patient Re-evaluated:Patient Re-evaluated prior to inductionOxygen Delivery Method: Circle system utilized Preoxygenation: Pre-oxygenation with 100% oxygen Intubation Type: IV induction Ventilation: Mask ventilation without difficulty Laryngoscope Size: Miller and 3 (Tooth gaurd utilized) Grade View: Grade I Tube type: Oral Tube size: 8.0 mm Number of attempts: 1 Airway Equipment and Method: Stylet

## 2014-08-30 NOTE — Op Note (Signed)
NAME:  Brad Mccarty, Brad Mccarty NO.:  [id number]  MEDICAL RECORD NO.:  [id number]  LOCATION:  [id number]                        FACILITY:  [facility]  PHYSICIAN:  Estill Bamberg, MD      DATE OF BIRTH:  [birth year]  DATE OF PROCEDURE:  08/30/2014                              OPERATIVE REPORT   PREOPERATIVE DIAGNOSES: 1. Severe L5-S1 degenerative disk disease. 2. Status post L5-S1 decompression. 3. Severe left-sided L5-S1 neural foraminal stenosis, resulting in     severe left L5 radiculopathy.  POSTOPERATIVE DIAGNOSES: 1. Severe L5-S1 degenerative disk disease. 2. Status post L5-S1 decompression. 3. Severe left-sided L5-S1 neural foraminal stenosis, resulting in     severe left L5 radiculopathy.  PROCEDURES: 1. Revision decompression, L5-S1, with near full facetectomy on the     left side. 2. Posterior spinal fusion, L5-S1. 3. Use of local autograft.  SURGEON:  Estill Bamberg, M.D.  ASSISTANT:  Brad Coop, PA-C  ANESTHESIA:  General endotracheal anesthesia.  COMPLICATIONS:  None.  DISPOSITION:  Stable.  ESTIMATED BLOOD LOSS:  600 mL.  INDICATIONS FOR SURGERY:  Briefly, Brad Mccarty is a 80 year old male who did present to me with severe pain in his low back and left leg.  The pain has been present for well over a year.  Of note, he is status post 2 different lumbar procedures, the details of which the patient was unclear of.  However, an MRI did reveal previous decompression at both the L4-5 and L5-S1 levels.  The MRI and radiographs did also reveal that the patient is status post an L2-L5 fusion procedure.  Also, of note, there was noted to be substantial L5-S1 intervertebral height loss, with substantial neural foraminal stenosis bilaterally, resulting in symptomatic left L5 radiculopathy.  We therefore discussed proceeding with the procedure reflected above and the patient did elect to proceed.  OPERATIVE DETAILS:  On August 30, 2014, the patient was brought  to surgery and general endotracheal anesthesia was administered.  The patient was placed prone on a well-padded flat Jackson table with a spinal frame.  Antibiotics were given and a time-out was performed.  The back was prepped and draped.  I then made a midline incision overlying the L5-S1 interspace.  A lateral intraoperative radiograph did confirm the appropriate operative level.  I then removed the spinous process of L5.  The lamina of L5 and S1 were subperiosteally exposed bilaterally. Of note, I did expose and decorticate the posterolateral gutter on the right side at L5-S1, including the right L5-S1 facet joint.  I then packed off the right side.  I then went forward with a central L5-S1 decompression and a bilateral partial facetectomy.  Of note, I did, at this point, explore the lateral recess on the left.  Of note, there were multiple adhesions identified between the dura and exiting L5 nerve and the surrounding structures, given the fact that the patient was status post a previous decompression in this area.  I did meticulously tease away the adhesions and I was ultimately able to adequately identify the dura and the exiting L5 nerve and the traversing S1 nerve.  I then proceeded with a near full facetectomy on the left  side.  The L5 nerve was identified at its origin in the lateral recess, and I did follow the nerve all the way out the neural foramen.  Of note, the neural foramen was initially extremely stenotic, and initially, it was extremely difficult to pass [facility] out the neural foramen.  However, I was able to perform a thorough and complete decompression of the neural foraminal area.  The decompression was confirmed by complete visualization of the exiting L5 nerve, and a decompression was additionally confirmed by passing a [facility] out the region of the neural foramen.  I was very pleased with the decompression that I was able to accomplish.  On the  right side, after decorticating the posterolateral gutter and facet joint, I did pack a liberal amount of autograft.  Prior to this, the wound was copiously irrigated with approximately 2 L of normal saline.  #15 deep Blake drain was then placed.  I then closed the fascia using #1 Vicryl.  The subcutaneous layer was closed using 2-0 Vicryl and the skin was closed using 3-0 Monocryl.  Benzoin and Steri-Strips were then applied followed by sterile dressing.  All instrument counts were correct at the termination of the procedure.  Of note, Brad Mccarty, was my assistant throughout surgery and did aid in retraction, suctioning, and closure throughout the surgery.     Estill Bamberg, MD     MD/MEDQ  D:  08/30/2014  T:  08/30/2014  Job:  [id number]

## 2014-08-30 NOTE — Anesthesia Postprocedure Evaluation (Signed)
  Anesthesia Post-op Note  Patient: Brad Mccarty  Procedure(s) Performed: Procedure(s) with comments: POSTERIOR LUMBAR FUSION 1 LEVEL (N/A) - Lumbar 5-sacrum 1 decompression and fusion with allograft  Patient Location: PACU  Anesthesia Type:General  Level of Consciousness: awake, alert  and oriented  Airway and Oxygen Therapy: Patient Spontanous Breathing and Patient connected to nasal cannula oxygen  Post-op Pain: mild  Post-op Assessment: Post-op Vital signs reviewed, Patient's Cardiovascular Status Stable, Respiratory Function Stable, Patent Airway and Pain level controlled  Post-op Vital Signs: stable  Last Vitals:  Filed Vitals:   08/30/14 1430  BP:   Pulse: 103  Temp: 36.4 C  Resp: 15    Complications: No apparent anesthesia complications

## 2014-08-30 NOTE — OR Nursing (Signed)
Foley inserted with no problems, urine return was prior to balloon inflation, urine was clear yellow.  Later, when undressing patient for positioning, fresh blood was seen around urethera.  Urine was still clear yellow.  Urologist on call was contacted. Dr. Crecencio Mc recommended that  if the patient has no prior blood in urine, then it is catheter trauma and didn't need a urology consult.  If the patient has had blood in his urine on and off for the last few months, then a consult was needed.  He stated that if Dr. Yevette Edwards had any additional concerns to call him.

## 2014-08-30 NOTE — H&P (Signed)
     PREOPERATIVE H&P  Chief Complaint: left leg pain  HPI: Brad Mccarty is a 80 y.o. male who presents with ongoing pain in the left leg  MRI reveals severe foraminal stenosis on left at L5/S1 and a previous fusion L2-5  Patient has failed multiple forms of conservative care and continues to have pain (see office notes for additional details regarding the patient's full course of treatment)  Past Medical History  Diagnosis Date  . Hypertension   . Arthritis    Past Surgical History  Procedure Laterality Date  . Joint replacement Bilateral     knee  . Back surgery     History   Social History  . Marital Status: Married    Spouse Name: N/A    Number of Children: N/A  . Years of Education: N/A   Social History Main Topics  . Smoking status: Never Smoker   . Smokeless tobacco: None  . Alcohol Use: Yes     Comment: socially  . Drug Use: No  . Sexual Activity: None   Other Topics Concern  . None   Social History Narrative  . None   History reviewed. No pertinent family history. Allergies  Allergen Reactions  . Sulfur Rash   Prior to Admission medications   Medication Sig Start Date End Date Taking? Authorizing Provider  allopurinol (ZYLOPRIM) 300 MG tablet Take 300 mg by mouth every morning.   Yes Historical Provider, MD  Ascorbic Acid (VITAMIN C) 1000 MG tablet Take 1,000 mg by mouth daily.   Yes Historical Provider, MD  Cyanocobalamin 2500 MCG CHEW Chew 2,500 mcg by mouth daily.   Yes Historical Provider, MD  donepezil (ARICEPT) 5 MG tablet Take 5 mg by mouth at bedtime.   Yes Historical Provider, MD  fluvastatin XL (LESCOL XL) 80 MG 24 hr tablet Take 80 mg by mouth every morning.   Yes Historical Provider, MD  folic acid (FOLVITE) 400 MCG tablet Take 400 mcg by mouth daily.   Yes Historical Provider, MD  gabapentin (NEURONTIN) 600 MG tablet Take 600 mg by mouth at bedtime.   Yes Historical Provider, MD  HYDROcodone-acetaminophen (NORCO/VICODIN) 5-325 MG per  tablet Take 1 tablet by mouth every 6 (six) hours as needed for moderate pain.   Yes Historical Provider, MD  olmesartan (BENICAR) 40 MG tablet Take 40 mg by mouth every morning.   Yes Historical Provider, MD  sulindac (CLINORIL) 200 MG tablet Take 200 mg by mouth every morning.   Yes Historical Provider, MD     All other systems have been reviewed and were otherwise negative with the exception of those mentioned in the HPI and as above.  Physical Exam: Filed Vitals:   08/30/14 0747  BP: 143/58  Pulse: 57  Temp: 97.7 F (36.5 C)  Resp: 18    General: Alert, no acute distress Cardiovascular: No pedal edema Respiratory: No cyanosis, no use of accessory musculature Skin: No lesions in the area of chief complaint Neurologic: Sensation intact distally Psychiatric: Patient is competent for consent with normal mood and affect Lymphatic: No axillary or cervical lymphadenopathy  MUSCULOSKELETAL: + SLR on left  Assessment/Plan: Low back pain and left leg pain Plan for Procedure(s): POSTERIOR LUMBAR DECOMPRESSION AND FUSION 1 LEVEL   Emilee Hero, MD 08/30/2014 8:14 AM

## 2014-08-30 NOTE — Anesthesia Preprocedure Evaluation (Addendum)
Anesthesia Evaluation  Patient identified by MRN, date of birth, ID band Patient awake    Reviewed: Allergy & Precautions, H&P , NPO status , Patient's Chart, lab work & pertinent test results  Airway Mallampati: II TM Distance: >3 FB Neck ROM: Full    Dental  (+) Teeth Intact, Dental Advisory Given   Pulmonary  breath sounds clear to auscultation        Cardiovascular hypertension, Rhythm:Regular Rate:Normal     Neuro/Psych    GI/Hepatic   Endo/Other    Renal/GU      Musculoskeletal   Abdominal   Peds  Hematology   Anesthesia Other Findings   Reproductive/Obstetrics                          Anesthesia Physical Anesthesia Plan  ASA: III  Anesthesia Plan: General   Post-op Pain Management:    Induction: Intravenous  Airway Management Planned: Oral ETT  Additional Equipment:   Intra-op Plan:   Post-operative Plan: Extubation in OR  Informed Consent: I have reviewed the patients History and Physical, chart, labs and discussed the procedure including the risks, benefits and alternatives for the proposed anesthesia with the patient or authorized representative who has indicated his/her understanding and acceptance.   Dental advisory given  Plan Discussed with: CRNA and Anesthesiologist  Anesthesia Plan Comments: (HNP L4-5 Hypertension Gout H/O elevated K on pre-op labs, follow-up BMET pending  Plan GA with oral ETT  Kipp Brood )        Anesthesia Quick Evaluation

## 2014-08-31 LAB — URINALYSIS, ROUTINE W REFLEX MICROSCOPIC
BILIRUBIN URINE: NEGATIVE
Glucose, UA: NEGATIVE mg/dL
Ketones, ur: NEGATIVE mg/dL
Nitrite: NEGATIVE
PH: 8 (ref 5.0–8.0)
PROTEIN: NEGATIVE mg/dL
Specific Gravity, Urine: 1.011 (ref 1.005–1.030)
UROBILINOGEN UA: 0.2 mg/dL (ref 0.0–1.0)

## 2014-08-31 LAB — URINE MICROSCOPIC-ADD ON

## 2014-08-31 MED FILL — Sodium Chloride IV Soln 0.9%: INTRAVENOUS | Qty: 1000 | Status: AC

## 2014-08-31 MED FILL — Heparin Sodium (Porcine) Inj 1000 Unit/ML: INTRAMUSCULAR | Qty: 30 | Status: AC

## 2014-08-31 MED FILL — Sodium Chloride Irrigation Soln 0.9%: Qty: 3000 | Status: AC

## 2014-08-31 NOTE — Progress Notes (Signed)
OT Cancellation Note  Patient Details Name: Brad Mccarty MRN: [id number] DOB: [birth year]   Cancelled Treatment:    Reason Eval/Treat Not Completed: Fatigue/lethargy limiting ability to participate;Pain limiting ability to participate (Eval attempted but pt in 8/10 pain -- RN in to give pain meds -- and pt falling asleep during conversation. Will check back for OT eval tomorrow.  Lyn Joens A 08/31/2014, 3:42 PM

## 2014-08-31 NOTE — Progress Notes (Signed)
    Patient doing well Left leg pain resolved + expected low back pain   Physical Exam: Filed Vitals:   08/31/14 0549  BP: 129/62  Pulse: 105  Temp: 100.2 F (37.9 C)  Resp:     Dressing in place NVI  Drain output: 150/12 hours (50cc of output was in last 8 hours), there is currently about 40cc of output in bulb  POD #1 s/p L5/S1 decompression and fusion, doing well with resolution of left leg pain  - up with PT/OT, encourage ambulation, brace when up and ambulating - Percocet for pain, Valium for muscle spasms - likely d/c home today, I will keep an eye on drainage and likely d/c drain prior to discharge

## 2014-08-31 NOTE — Evaluation (Signed)
Physical Therapy Evaluation Patient Details Name: Brad Mccarty MRN: [id number] DOB: 1933-12-28 Today's Date: 08/31/2014   History of Present Illness  Pt is a 80 y.o. male s/p L5-S1 decompression and posterior fusion 08-30-14.  Clinical Impression  Patient is s/p L5-S1 decompression and fusion surgery resulting in functional limitations due to the deficits listed below (see PT Problem List). Pt required min to mod assist for functional mobility.  He ambulated 10 ft with RW with min assist.  Pt and spouse educated on don/doff of TLSO, including need to release locked extension of leg attachment for stand to sit. Patient will benefit from skilled PT to increase their independence and safety with mobility to allow discharge to the venue listed below.  Pt's desire is to d/c home today.  Pt will need to demo decreased assist with mobility, increased gait distance, and complete stair training prior to d/c home.     Follow Up Recommendations Home health PT    Equipment Recommendations  Rolling walker with 5" wheels    Recommendations for Other Services OT consult     Precautions / Restrictions Precautions Precautions: Back Precaution Booklet Issued: Yes (comment) Precaution Comments: Educated pt on 3/3 back precautions. Required Braces or Orthoses: Spinal Brace Spinal Brace: Thoracolumbosacral orthotic;Applied in sitting position Restrictions Weight Bearing Restrictions: No      Mobility  Bed Mobility Overal bed mobility: Needs Assistance Bed Mobility: Sidelying to Sit;Rolling Rolling: Min assist Sidelying to sit: Min assist       General bed mobility comments: verbal/tactile cues for logroll  Transfers Overall transfer level: Needs assistance Equipment used: Rolling walker (2 wheeled) Transfers: Sit to/from [employer] Sit to Stand: Mod assist Stand pivot transfers: Min assist       General transfer comment: verbal cues for hand placement; verbal cues and  assist to release LLE brace attatchment for stand to sit  Ambulation/Gait Ambulation/Gait assistance: Min assist Ambulation Distance (Feet): 10 Feet Assistive device: Rolling walker (2 wheeled) Gait Pattern/deviations: Decreased stride length;Step-through pattern;Antalgic Gait velocity: decreased      Stairs            Wheelchair Mobility    Modified Rankin (Stroke Patients Only)       Balance                                             Pertinent Vitals/Pain Pain Assessment: 0-10 Pain Score: 9  Pain Location: back and LLE Pain Intervention(s): Monitored during session;Repositioned;PCA encouraged    Home Living Family/patient expects to be discharged to:: Private residence Living Arrangements: Spouse/significant other Available Help at Discharge: Family;Available 24 hours/day Type of Home: House Home Access: Stairs to enter Entrance Stairs-Rails: Left Entrance Stairs-Number of Steps: 3 Home Layout: One level Home Equipment: Walker - standard;Cane - single point      Prior Function Level of Independence: Independent         Comments: playing golf     Hand Dominance        Extremity/Trunk Assessment                         Communication   Communication: No difficulties  Cognition Arousal/Alertness: Awake/alert Behavior During Therapy: WFL for tasks assessed/performed Overall Cognitive Status: Within Functional Limits for tasks assessed  General Comments      Exercises        Assessment/Plan    PT Assessment Patient needs continued PT services  PT Diagnosis Difficulty walking;Acute pain   PT Problem List Decreased activity tolerance;Decreased mobility;Decreased knowledge of precautions;Pain;Decreased strength;Decreased knowledge of use of DME  PT Treatment Interventions DME instruction;Gait training;Stair training;Functional mobility training;Therapeutic activities;Patient/family  education;Balance training   PT Goals (Current goals can be found in the Care Plan section) Acute Rehab PT Goals Patient Stated Goal: home today PT Goal Formulation: With patient Time For Goal Achievement: 09/07/14 Potential to Achieve Goals: Good    Frequency Min 5X/week   Barriers to discharge        Co-evaluation               End of Session Equipment Utilized During Treatment: Gait belt Activity Tolerance: Patient tolerated treatment well Patient left: in chair;with call bell/phone within reach;with family/visitor present Nurse Communication: Mobility status         Time: 1610-9604 PT Time Calculation (min): 33 min   Charges:   PT Evaluation $Initial PT Evaluation Tier I: 1 Procedure PT Treatments $Therapeutic Activity: 23-37 mins   PT G Codes:          Ilda Foil 08/31/2014, 10:41 AM

## 2014-09-01 MED ORDER — SODIUM CHLORIDE 0.9 % IV BOLUS (SEPSIS)
500.0000 mL | Freq: Once | INTRAVENOUS | Status: AC
  Administered 2014-09-01: 500 mL via INTRAVENOUS

## 2014-09-01 MED ORDER — POLYETHYLENE GLYCOL 3350 17 G PO PACK
17.0000 g | PACK | Freq: Every day | ORAL | Status: DC
  Filled 2014-09-01: qty 1

## 2014-09-01 NOTE — Progress Notes (Signed)
Patient voided 450 cc in urinal - bladder scan = 120 cc post-void residual. Encouraged po intake.

## 2014-09-01 NOTE — Progress Notes (Signed)
Patient made this RN aware that this AM around 0415, patient voided for the first time on his own. Patient only voided 50 ml in urinal. Bladder scan was done - 550 ml; in and out cath done - 800 ml out. Patient denies any prostrate issues. There was a tinge of blood after the catheter was removed but nothing actively bleeding. Will continue to monitor.

## 2014-09-01 NOTE — Progress Notes (Signed)
Physical Therapy Treatment Patient Details Name: Brad Mccarty MRN: [id number] DOB: [birth year] Today's Date: 09/01/2014    History of Present Illness Pt is a 80 y.o. male s/p L5-S1 decompression and posterior fusion 08-30-14.    PT Comments    Patient able to complete stair training this afternoon. Wife feels comfortable with mobility and has son and daughter in law at home to assist with care as well. The brace appeared to be shifting with ambulation. I called [employer] and they are having a rep come out and check the brace later this afternoon. Patient safe to D/C from a mobility standpoint based on progression towards goals set on PT eval.    Follow Up Recommendations  Home health PT     Equipment Recommendations  Rolling walker with 5" wheels    Recommendations for Other Services       Precautions / Restrictions Precautions Precautions: Back Precaution Comments: Educated pt on 3/3 back precautions. Required Braces or Orthoses: Spinal Brace Spinal Brace: Thoracolumbosacral orthotic;Applied in sitting position    Mobility  Bed Mobility Overal bed mobility: Needs Assistance Bed Mobility: Sidelying to Sit;Rolling Rolling: Min assist Sidelying to sit: Min assist       General bed mobility comments: verbal/tactile cues for logroll  Transfers Overall transfer level: Needs assistance Equipment used: Rolling walker (2 wheeled)   Sit to Stand: Min assist         General transfer comment: A to ensure safety. Cues for hand placement  Ambulation/Gait Ambulation/Gait assistance: Min guard Ambulation Distance (Feet): 150 Feet Assistive device: Rolling walker (2 wheeled) Gait Pattern/deviations: Step-through pattern;Decreased stride length Gait velocity: decreased   General Gait Details: Patient took several steps before L LE starting ambulating with step through but once started he did very well.    Stairs Stairs: Yes Stairs assistance: Min assist Stair Management:  Forwards;One rail Left;Step to pattern Number of Stairs: 5 General stair comments: Min A for R HHA.   Wheelchair Mobility    Modified Rankin (Stroke Patients Only)       Balance                                    Cognition Arousal/Alertness: Awake/alert Behavior During Therapy: WFL for tasks assessed/performed Overall Cognitive Status: Within Functional Limits for tasks assessed                      Exercises      General Comments        Pertinent Vitals/Pain Pain Assessment: No/denies pain    Home Living                      Prior Function            PT Goals (current goals can now be found in the care plan section) Progress towards PT goals: Progressing toward goals    Frequency  Min 5X/week    PT Plan Current plan remains appropriate    Co-evaluation             End of Session Equipment Utilized During Treatment: Gait belt;Back brace Activity Tolerance: Patient tolerated treatment well Patient left: in chair;with call bell/phone within reach;with family/visitor present     Time: 4098-1191 PT Time Calculation (min): 18 min  Charges:  $Gait Training: 8-22 mins $Therapeutic Activity: 8-22 mins  G Codes:      Fredrich Birks 09/01/2014, 3:08 PM 09/01/2014 Fredrich Birks PTA [phone] pager [phone] office

## 2014-09-01 NOTE — Progress Notes (Signed)
Occupational Therapy Evaluation Patient Details Name: Brad Mccarty MRN: [id number] DOB: 1934/12/04 Today's Date: 09/01/2014    History of Present Illness Pt is a 80 y.o. male s/p L5-S1 decompression and posterior fusion 08-30-14.   Clinical Impression   PTA pt lived at home and was independent with ADLs and functional mobility. Pt with poor recall of back precautions, however progressing well with mobility. Pt requires Supervision for all ADLs to adhere to back precautions and requires min (A) for safety with transfers. Pt is d/c this afternoon. Provided education to pt and wife on fall prevention and energy conservation.     Follow Up Recommendations  No OT follow up;Supervision/Assistance - 24 hour    Equipment Recommendations  3 in 1 bedside comode    Recommendations for Other Services       Precautions / Restrictions Precautions Precautions: Back Precaution Booklet Issued: Yes (comment) Precaution Comments: Educated pt on 3/3 back precautions and incorporating into ADLs. Pt with poor recall.  Required Braces or Orthoses: Spinal Brace Spinal Brace: Thoracolumbosacral orthotic;Applied in sitting position Restrictions Weight Bearing Restrictions: No      Mobility Bed Mobility               General bed mobility comments: Pt sitting up in recliner when OT arrived.  Transfers Overall transfer level: Needs assistance Equipment used: Rolling walker (2 wheeled) Transfers: Sit to/from Stand Sit to Stand: Min assist         General transfer comment: A to ensure safety. Cues for hand placement         ADL Overall ADL's : Needs assistance/impaired Eating/Feeding: Independent;Sitting   Grooming: Min guard;Standing   Upper Body Bathing: Set up;Sitting;Supervision/ safety   Lower Body Bathing: Set up;Sit to/from stand;Supervison/ safety   Upper Body Dressing : Supervision/safety;Set up;Sitting   Lower Body Dressing: Supervision/safety;Set up;Sit to/from stand    Toilet Transfer: Minimal assistance;RW             General ADL Comments: Pt requires supervision for ADL activity due to poor recall of precautions. Pt able to reach Bil socks using figure four method. Educated pt and wife on fall prevention techniques such as sitting for ADLs and adequate lighting in home.      Vision  Pt reports no change from baseline. No apparent deficits.                    Perception Perception Perception Tested?: No   Praxis Praxis Praxis tested?: Within functional limits    Pertinent Vitals/Pain Pain Assessment: No/denies pain     Hand Dominance     Extremity/Trunk Assessment Upper Extremity Assessment Upper Extremity Assessment: Overall WFL for tasks assessed   Lower Extremity Assessment Lower Extremity Assessment: Overall WFL for tasks assessed   Cervical / Trunk Assessment Cervical / Trunk Assessment: Kyphotic (head forward)   Communication Communication Communication: No difficulties   Cognition Arousal/Alertness: Awake/alert Behavior During Therapy: WFL for tasks assessed/performed Overall Cognitive Status: Within Functional Limits for tasks assessed                                Home Living Family/patient expects to be discharged to:: Private residence Living Arrangements: Spouse/significant other Available Help at Discharge: Family;Available 24 hours/day Type of Home: House Home Access: Stairs to enter [employer] of Steps: 3 Entrance Stairs-Rails: Left Home Layout: One level     Bathroom Shower/Tub: Arts development officer  Toilet: Standard     Home Equipment: Walker - standard;Cane - single point          Prior Functioning/Environment Level of Independence: Independent        Comments: plays golf                              End of Session Equipment Utilized During Treatment: Gait belt;Rolling walker;Back brace  Activity Tolerance: Patient tolerated treatment  well Patient left: Other (comment) (in Rehab gym with PT)   Time: 1610-9604 OT Time Calculation (min): 23 min Charges:  OT General Charges $OT Visit: 1 Procedure OT Treatments $Self Care/Home Management : 8-22 mins  Rae Lips [phone] 09/01/2014, 5:06 PM

## 2014-09-01 NOTE — Progress Notes (Signed)
BP 70-80 systolic. PA notified and order received.Patient asymptomatic.

## 2014-09-01 NOTE — Care Management Note (Signed)
CARE MANAGEMENT NOTE 09/01/2014  Patient:  Brad Mccarty, Brad Mccarty   Account Number:  [id number]  Date Initiated:  09/01/2014  Documentation initiated by:  Vance Peper  Subjective/Objective Assessment:   80 yr old male s/p revision decompression L5-S1 with facetectomy.     Action/Plan:   Case manager spoke with pateint and wife concerning home health and DME needs. Patient states he has a RW and son is getting 3in1. They request [facility], CM will fax demographics to [employer], referral called.   Anticipated DC Date:  09/02/2014   Anticipated DC Plan:  HOME W HOME HEALTH SERVICES      DC Planning Services  CM consult      [facility] Choice  HOME HEALTH   Choice offered to / List presented to:  C-1 Patient   DME arranged  NA        HH arranged  HH-2 PT      [facility] agency  [facility]   Status of service:  In process, will continue to follow Medicare Important Message given?  YES (If response is "NO", the following Medicare IM given date fields will be blank) Date Medicare IM given:  09/01/2014 Medicare IM given by:  Vance Peper Date Additional Medicare IM given:   Additional Medicare IM given by:    Discharge Disposition:  HOME W HOME HEALTH SERVICES  Per UR Regulation:  Reviewed for med. necessity/level of care/duration of stay

## 2014-09-01 NOTE — Progress Notes (Signed)
    Patient overall doing well Progressing slowing with PT Minimal pain   Physical Exam: Filed Vitals:   09/01/14 0731  BP: 84/60  Pulse:   Temp:   Resp:     Dressing in place NVI  Drain output 15cc/12 hours --> d/c'ed  POD #2 s/p L5/S1 decompression and fusion, doing well with resolution of left leg pain  - up with PT/OT, encourage ambulation - Percocet for pain, Valium for muscle spasms - will bolus fluids for slight hypotension and follow - likely d/c home today vs. tomorrow

## 2014-09-01 NOTE — Progress Notes (Signed)
Physical Therapy Treatment Patient Details Name: Brad Mccarty MRN: [id number] DOB: [birth year] Today's Date: 09/01/2014    History of Present Illness Pt is a 80 y.o. male s/p L5-S1 decompression and posterior fusion 08-30-14.    PT Comments    Patient making good progress with ambulation this session. Educated on back precautions, brace wear and sitting schedule. Patient is wanting to go home today. Will see patient again this PM to attempt steps if home is still the plan today.   Follow Up Recommendations  Home health PT     Equipment Recommendations  Rolling walker with 5" wheels    Recommendations for Other Services       Precautions / Restrictions Precautions Precautions: Back Precaution Comments: Educated pt on 3/3 back precautions. Required Braces or Orthoses: Spinal Brace Spinal Brace: Thoracolumbosacral orthotic;Applied in sitting position Restrictions Weight Bearing Restrictions: No    Mobility  Bed Mobility Overal bed mobility: Needs Assistance Bed Mobility: Sidelying to Sit;Rolling Rolling: Min assist Sidelying to sit: Min assist       General bed mobility comments: verbal/tactile cues for logroll  Transfers Overall transfer level: Needs assistance Equipment used: Rolling walker (2 wheeled)   Sit to Stand: Min assist         General transfer comment: verbal cues for hand placement; verbal cues and assist to release LLE brace attatchment for stand to sit. A to power up into standing and to ensure safety with sitting  Ambulation/Gait Ambulation/Gait assistance: Min guard Ambulation Distance (Feet): 120 Feet Assistive device: Rolling walker (2 wheeled) Gait Pattern/deviations: Step-through pattern;Decreased stride length Gait velocity: decreased   General Gait Details: Patient took several steps before L LE starting ambulating with step through but once started he did very well.    Stairs            Wheelchair Mobility    Modified Rankin  (Stroke Patients Only)       Balance                                    Cognition Arousal/Alertness: Awake/alert Behavior During Therapy: WFL for tasks assessed/performed Overall Cognitive Status: Within Functional Limits for tasks assessed                      Exercises      General Comments        Pertinent Vitals/Pain Pain Assessment: No/denies pain    Home Living                      Prior Function            PT Goals (current goals can now be found in the care plan section) Progress towards PT goals: Progressing toward goals    Frequency  Min 5X/week    PT Plan Current plan remains appropriate    Co-evaluation             End of Session Equipment Utilized During Treatment: Gait belt;Back brace Activity Tolerance: Patient tolerated treatment well Patient left: in chair;with call bell/phone within reach;with family/visitor present     Time: 3244-0102 PT Time Calculation (min): 26 min  Charges:  $Gait Training: 8-22 mins $Therapeutic Activity: 8-22 mins                    G Codes:      Fredrich Birks 09/01/2014, 11:55  AM 09/01/2014 Fredrich Birks PTA [phone] pager [phone] office

## 2014-09-01 NOTE — Progress Notes (Signed)
Patient with systolic BP 90s -states BP on admission was the same. Denies dizziness.Received NS bolus earlier.  To check BP at home and call ordering MD before takes BP medication in a.m if no change.D/C in stable condition with scripts.

## 2014-09-02 NOTE — Care Management Note (Addendum)
CARE MANAGEMENT NOTE 09/02/2014  Patient:  Brad Mccarty, Brad Mccarty   Account Number:  [id number]  Date Initiated:  09/01/2014  Documentation initiated by:  Vance Peper  Subjective/Objective Assessment:   80 yr old male s/p revision decompression L5-S1 with facetectomy.     Action/Plan:   Case manager spoke with pateint and wife concerning home health and DME needs. Patient states he has a RW and son is getting 3in1. They request [facility], CM will fax demographics to [employer], referral called.   Anticipated DC Date:  09/02/2014   Anticipated DC Plan:  HOME W HOME HEALTH SERVICES      DC Planning Services  CM consult      [facility] Choice  HOME HEALTH   Choice offered to / List presented to:  C-1 Patient   DME arranged  NA        HH arranged  HH-2 PT      [facility] agency  [facility]   Status of service:  Completed, signed off Medicare Important Message given?  YES (If response is "NO", the following Medicare IM given date fields will be blank) Date Medicare IM given:  09/01/2014 Medicare IM given by:  Vance Peper Date Additional Medicare IM given:   Additional Medicare IM given by:    Discharge Disposition:  HOME W HOME HEALTH SERVICES  Per UR Regulation:  Reviewed for med. necessity/level of care/duration of stay  If discussed at Long Length of Stay Meetings, dates discussed:    Comments:  09/02/14 0830 Vance Peper, RN BSN Case Manager Orders were faxed to [facility]. Start of care will be 09/06/2014. They have no available staff before that date, will call patient if they have a cancellation. Case manager will contact patient.

## 2014-09-07 NOTE — Discharge Summary (Signed)
Patient ID: Brad Mccarty MRN: [id number] DOB/AGE: [birth year] 78 y.o.  Admit date: 08/30/2014 Discharge date: 09/01/2014  Admission Diagnoses:  Active Problems:   Radiculopathy   Discharge Diagnoses:  Same  Past Medical History  Diagnosis Date  . Hypertension   . Arthritis     Surgeries: Procedure(s): POSTERIOR LUMBAR FUSION 1 LEVEL L5-S1 on 08/30/2014   Consultants:  None  Discharged Condition: Improved  Hospital Course: Brad Mccarty is an 80 y.o. male who was admitted 08/30/2014 for operative treatment of radiculopathy. Patient has severe unremitting pain that affects sleep, daily activities, and work/hobbies. After pre-op clearance the patient was taken to the operating room on 08/30/2014 and underwent  Procedure(s): POSTERIOR LUMBAR FUSION 1 LEVEL L5-S1.    Patient was given perioperative antibiotics:  Anti-infectives   Start     Dose/Rate Route Frequency Ordered Stop   08/30/14 2100  ceFAZolin (ANCEF) IVPB 1 g/50 mL premix     1 g 100 mL/hr over 30 Minutes Intravenous Every 8 hours 08/30/14 1543 08/31/14 0642   08/30/14 0600  ceFAZolin (ANCEF) IVPB 2 g/50 mL premix     2 g 100 mL/hr over 30 Minutes Intravenous On call to O.R. 08/29/14 1400 08/30/14 1256       Patient was given sequential compression devices, early ambulation to prevent DVT.  Patient benefited maximally from hospital stay and there were no complications.    Recent vital signs: BP 96/55  Pulse 89  Temp(Src) 98.6 F (37 C) (Oral)  Resp 16  Ht  (1.702 m)  Wt 75.297 kg (166 lb)  BMI 25.99 kg/m2  SpO2 95%   Discharge Medications:     Medication List    STOP taking these medications       HYDROcodone-acetaminophen 5-325 MG per tablet  Commonly known as:  NORCO/VICODIN      TAKE these medications       allopurinol 300 MG tablet  Commonly known as:  ZYLOPRIM  Take 300 mg by mouth every morning.     Cyanocobalamin 2500 MCG Chew  Chew 2,500 mcg by mouth daily.     donepezil 5 MG  tablet  Commonly known as:  ARICEPT  Take 5 mg by mouth at bedtime.     fluvastatin XL 80 MG 24 hr tablet  Commonly known as:  LESCOL XL  Take 80 mg by mouth every morning.     folic acid 400 MCG tablet  Commonly known as:  FOLVITE  Take 400 mcg by mouth daily.     gabapentin 600 MG tablet  Commonly known as:  NEURONTIN  Take 600 mg by mouth at bedtime.     olmesartan 40 MG tablet  Commonly known as:  BENICAR  Take 40 mg by mouth every morning.     sulindac 200 MG tablet  Commonly known as:  CLINORIL  Take 200 mg by mouth every morning.     vitamin C 1000 MG tablet  Take 1,000 mg by mouth daily.        Diagnostic Studies: Dg Chest 2 View  08/22/2014   CLINICAL DATA:  Preop for lumbar surgery  EXAM: CHEST  2 VIEW  COMPARISON:  08/09/2010  FINDINGS: Cardiomediastinal silhouette is stable. No acute infiltrate or pleural effusion. No pulmonary edema. Bony thorax is unremarkable.  IMPRESSION: No active cardiopulmonary disease.   Electronically Signed   By: Natasha Mead M.D.   On: 08/22/2014 13:40   Dg Lumbar Spine 2-3 Views  08/30/2014  CLINICAL DATA:  Intraoperative localization films.  EXAM: LUMBAR SPINE - 2-3 VIEW  COMPARISON:  MRI lumbar spine 02/25/2014.  FINDINGS: We are provided with 3 intraoperative views the lumbar spine in the lateral projection. On the first image, probes are at the level of the L5 pedicles and the S2-3 level. On the second image, the L5-S1 level is localized. On the third film, probe projects at the level of mid S1.  IMPRESSION: Localization is above.   Electronically Signed   By: Drusilla Kanner M.D.   On: 08/30/2014 14:08    Disposition: 06-Home-Health Care Svc   POD #2 s/p L5/S1 decompression and fusion, doing well with resolution of left leg pain  - up with PT/OT, encourage ambulation  - Percocet for pain, Valium for muscle spasms  - will bolus fluids for slight hypotension and follow  -Written scripts for pain signed and in chart -D/C  instructions sheet printed and in chart -D/C today  -F/U in office 2 weeks   Signed: Georga Mccarty 09/07/2014, 11:23 AM

## 2015-02-03 IMAGING — CR DG CHEST 2V
2 series · 2 of 2 positions shown · non-contrast
Comparison: 08/09/2010

CLINICAL DATA: Preop for lumbar surgery

EXAM:
CHEST  2 VIEW

[w chest pa]
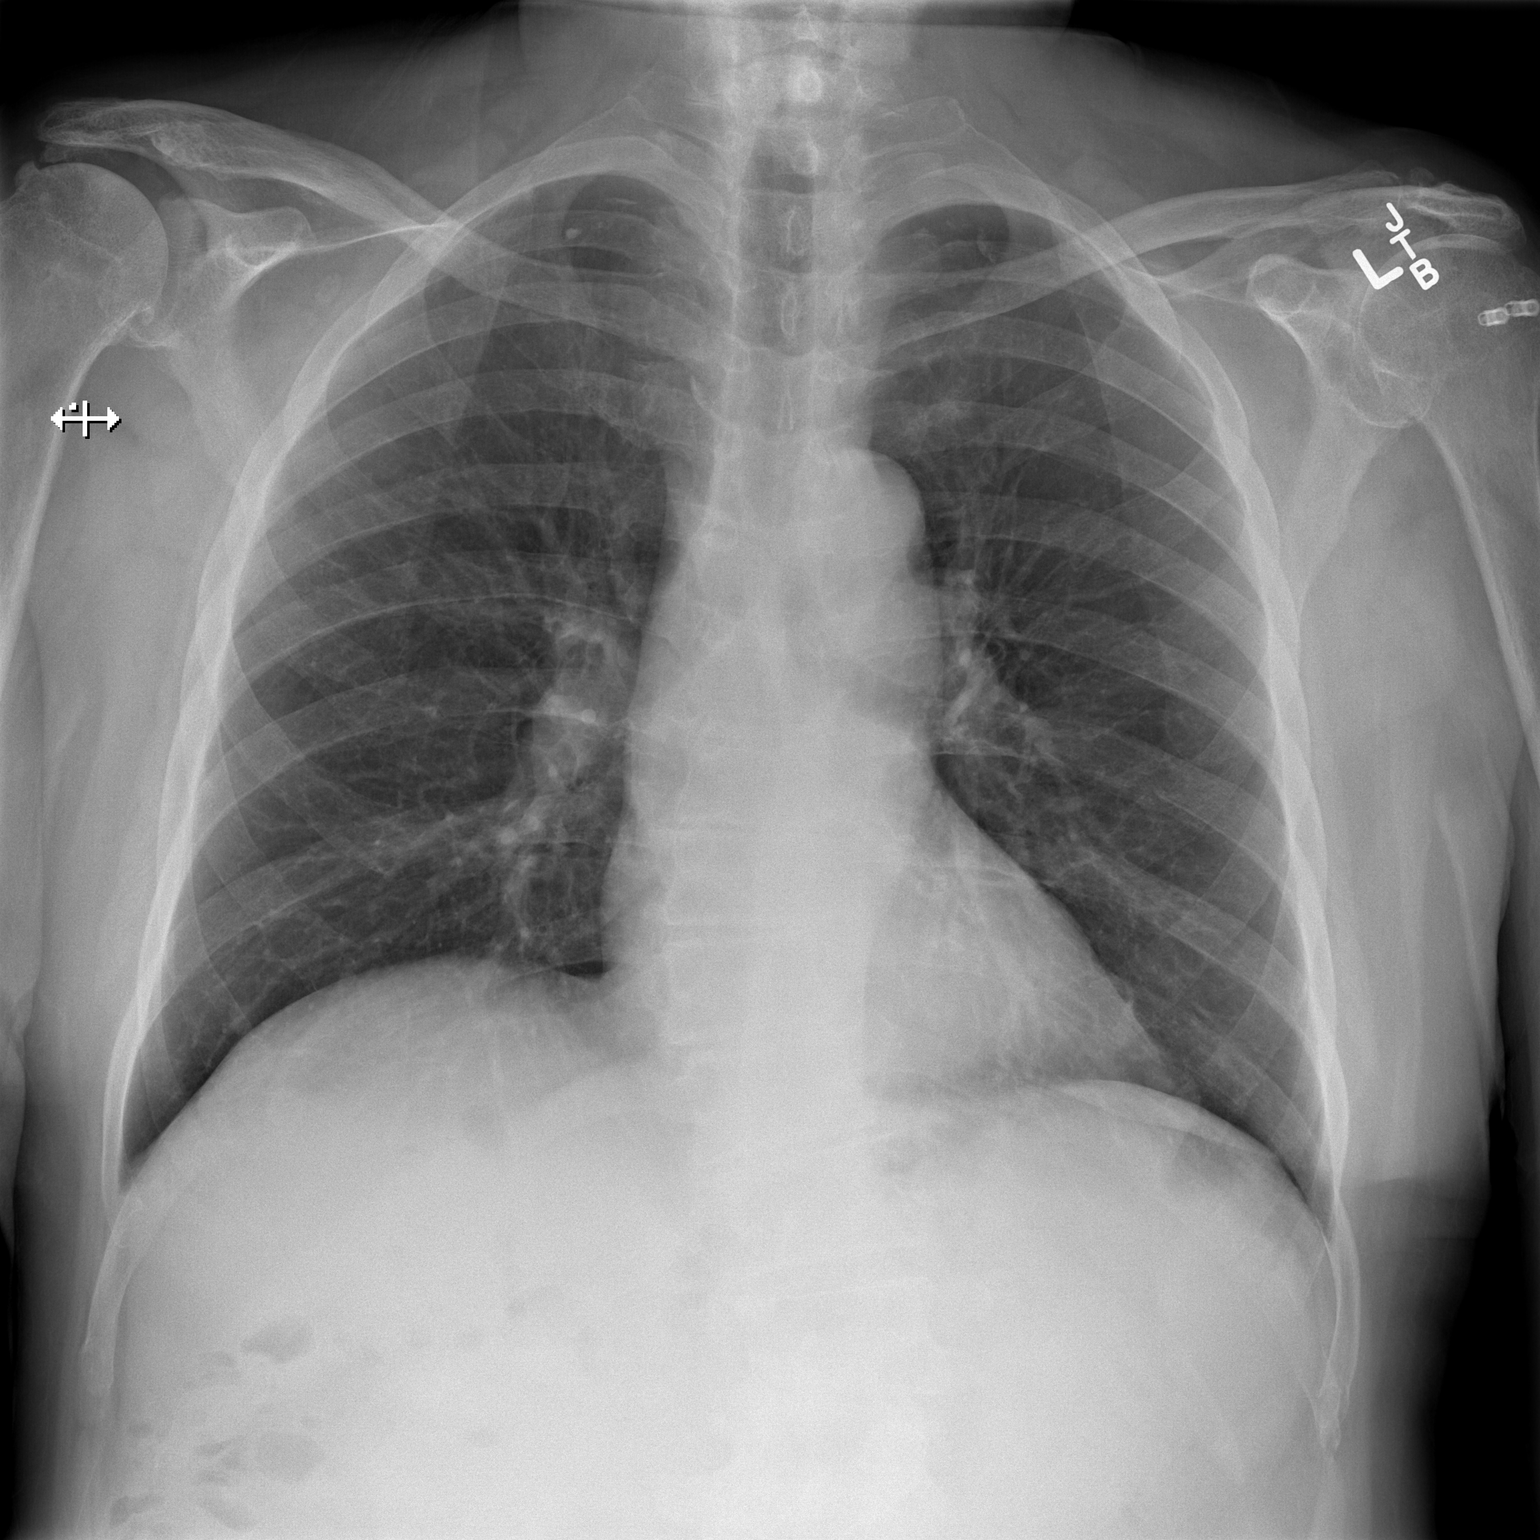

[w chest lat]
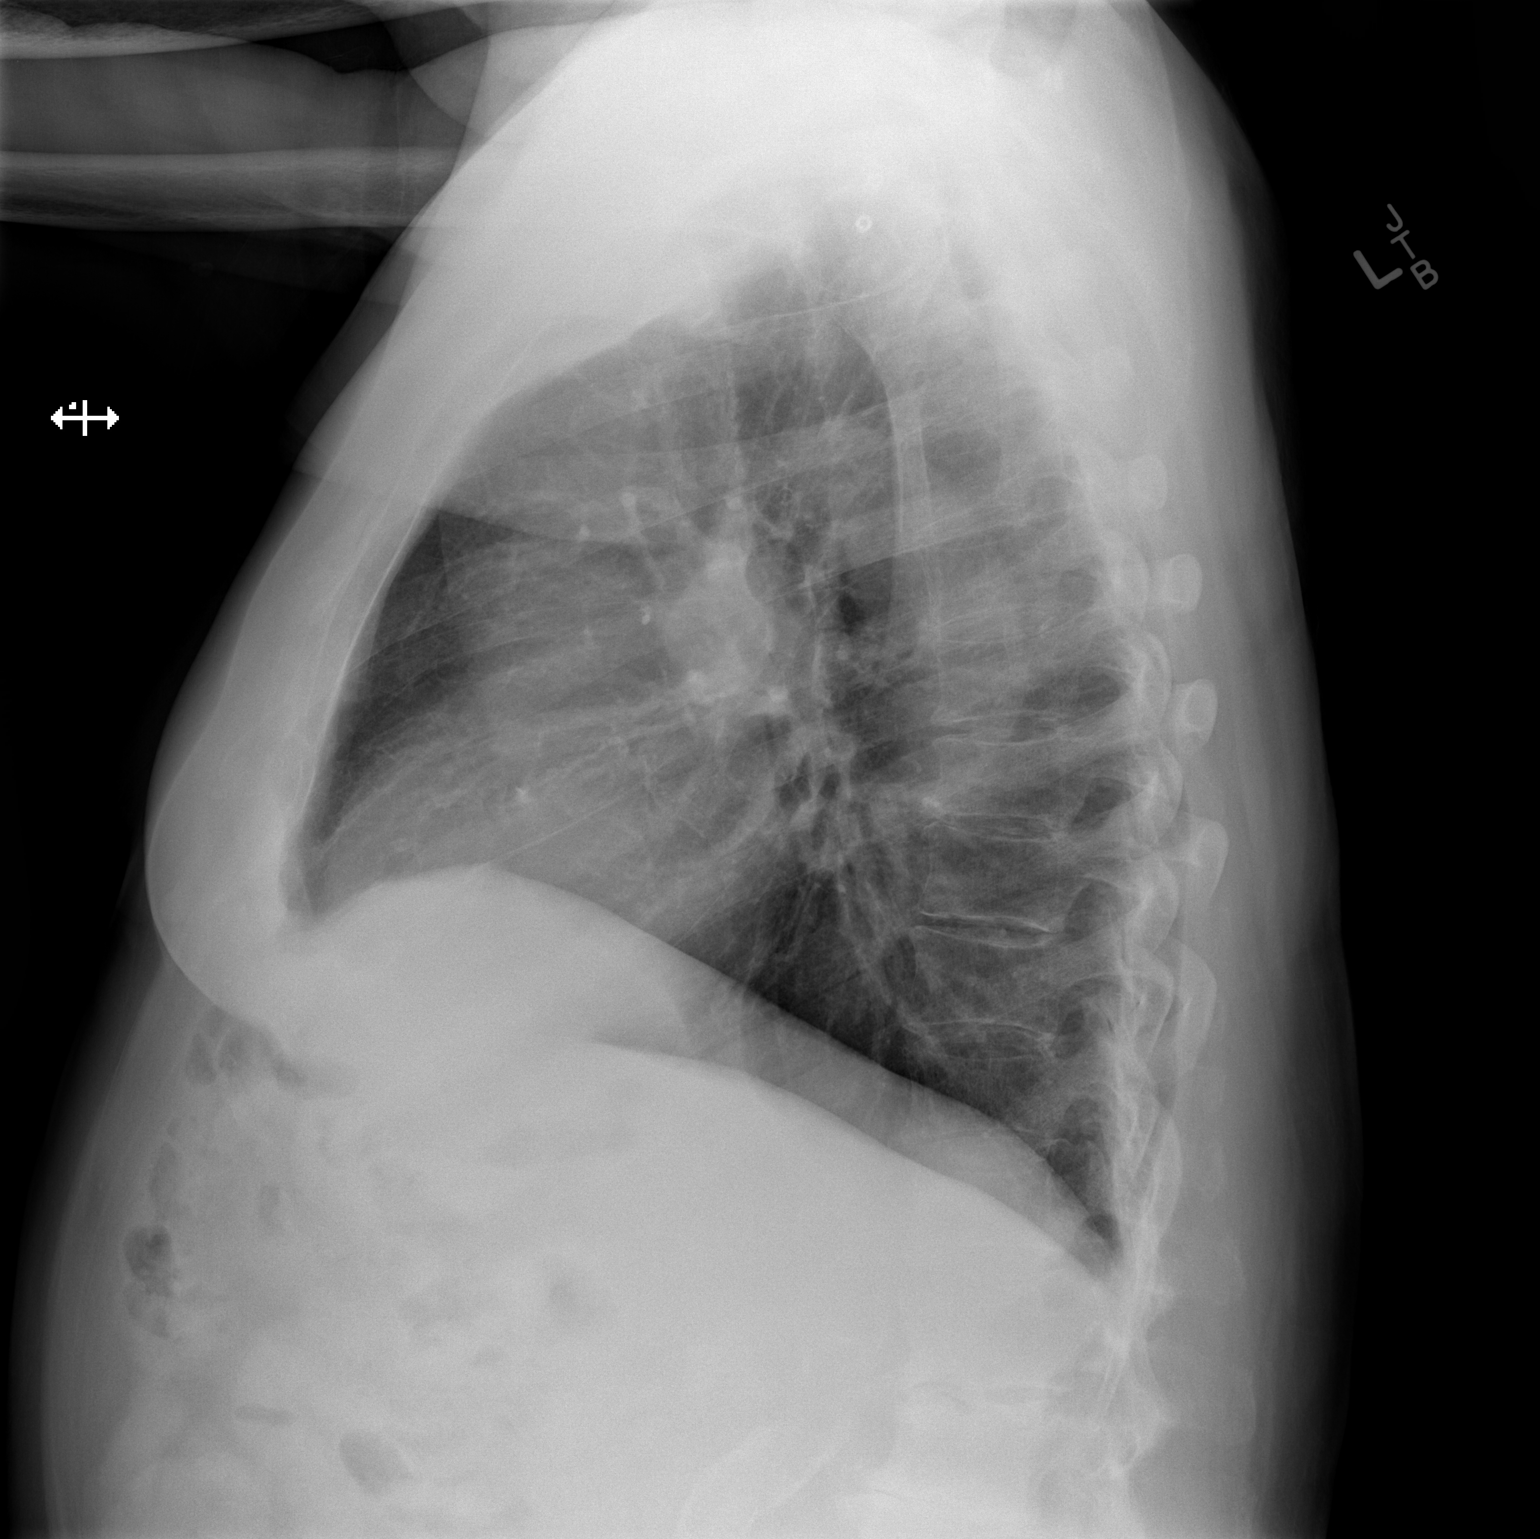

[2 of 2 positions shown; findings below may reference images not displayed]

FINDINGS: Cardiomediastinal silhouette is stable. No acute infiltrate or
pleural effusion. No pulmonary edema. Bony thorax is unremarkable.
IMPRESSION: No active cardiopulmonary disease.

## 2022-09-21 DEATH — deceased
